# Patient Record
Sex: Male | Born: 1975 | Race: White | Hispanic: No | Marital: Married | Smoking: Never smoker
Health system: Southern US, Community
[De-identification: ages and names within clinical notes are randomized; demographics above are authoritative.]

---

## 2006-04-28 ENCOUNTER — Emergency Department: Payer: Self-pay | Admitting: Emergency Medicine

## 2007-06-18 ENCOUNTER — Emergency Department: Payer: Self-pay | Admitting: Unknown Physician Specialty

## 2009-08-17 IMAGING — CT CT ABD-PELV W/O CM
1 of 2 series · 15 of 32 positions shown, 19 images · non-contrast
Comparison: none

REASON FOR EXAM: (1) left flank pain; (2) left flank pain
COMMENTS:

PROCEDURE:     CT  - CT ABDOMEN AND PELVIS W[DATE]  [DATE]
RESULT:     Comparison: No available comparison exam.
TECHNIQUE: CT examination of the abdomen and pelvis was performed without
contrast. Collimation is 3 mm.

[Series 2: stone · axial · 0.79mm/px · z∈[-554,-144]mm · 15 of 155 slices shown, 19 images]
[im 12/155  soft-tissue]
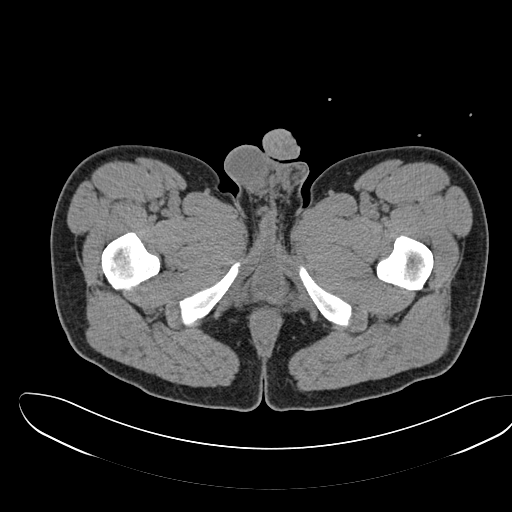
[im 12/155  bone]
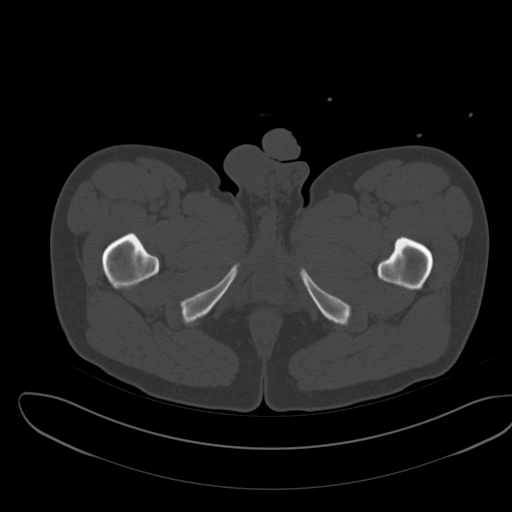
[im 23/155  soft-tissue]
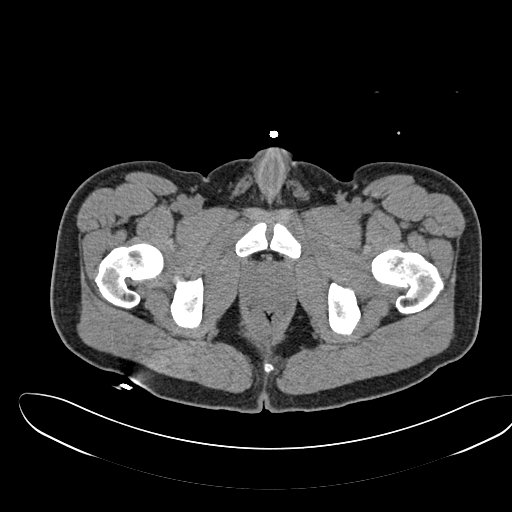
[im 34/155  soft-tissue]
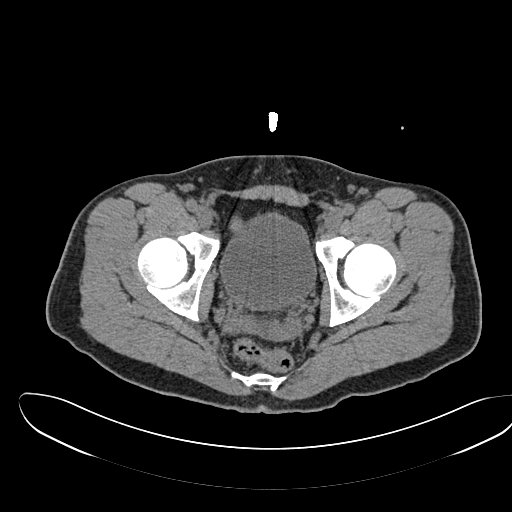
[im 45/155  soft-tissue]
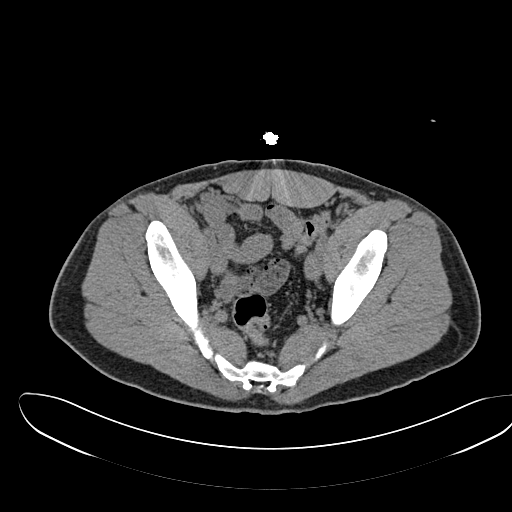
[im 56/155  soft-tissue]
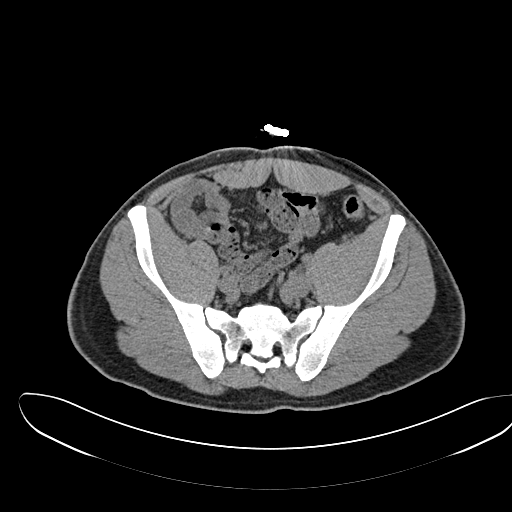
[im 67/155  soft-tissue]
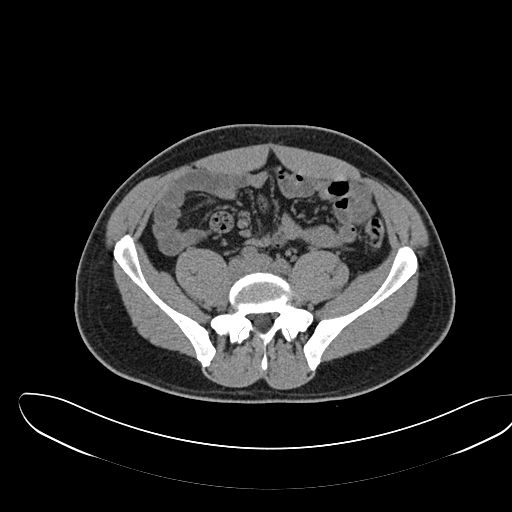
[im 78/155  soft-tissue]
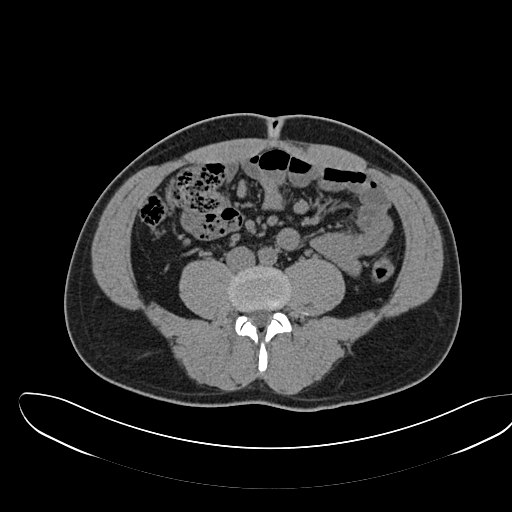
[im 89/155  soft-tissue]
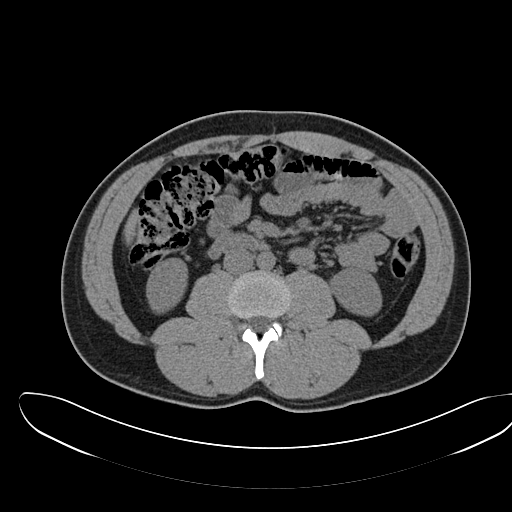
[im 100/155  soft-tissue]
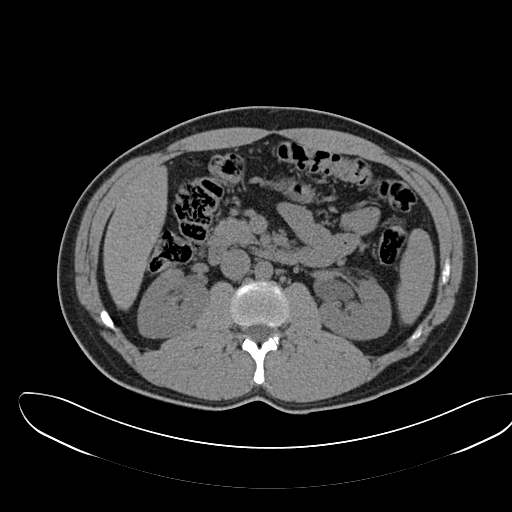
[im 100/155  bone]
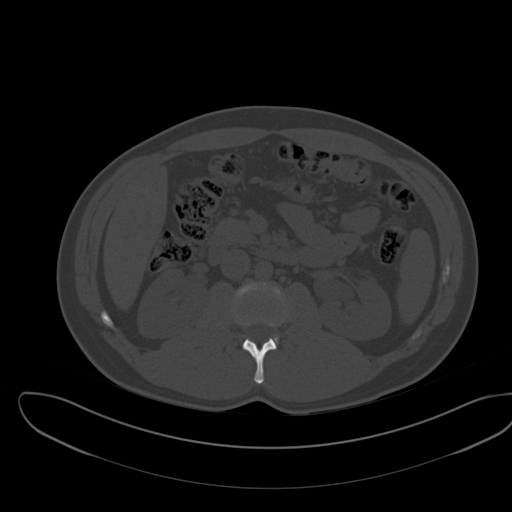
[im 111/155  soft-tissue]
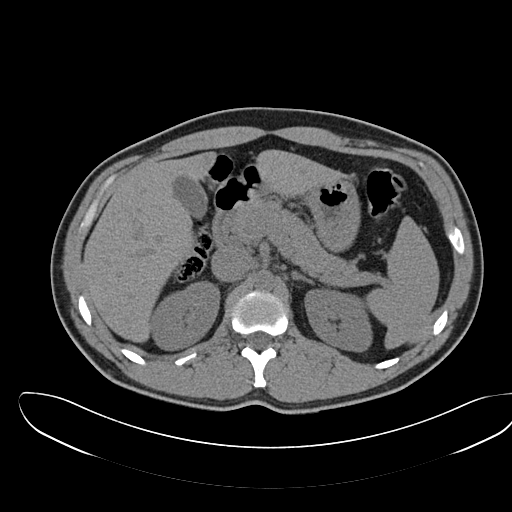
[im 122/155  soft-tissue]
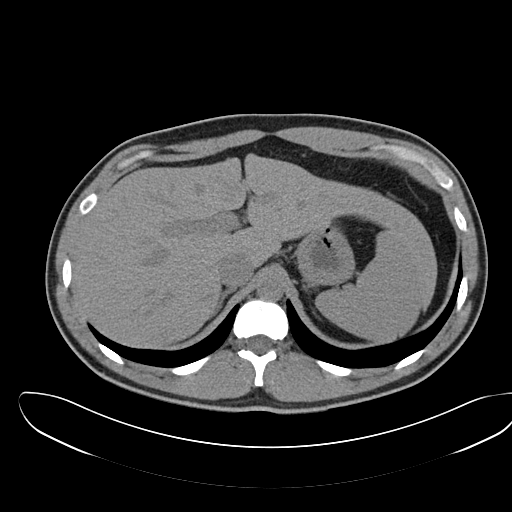
[im 133/155  soft-tissue]
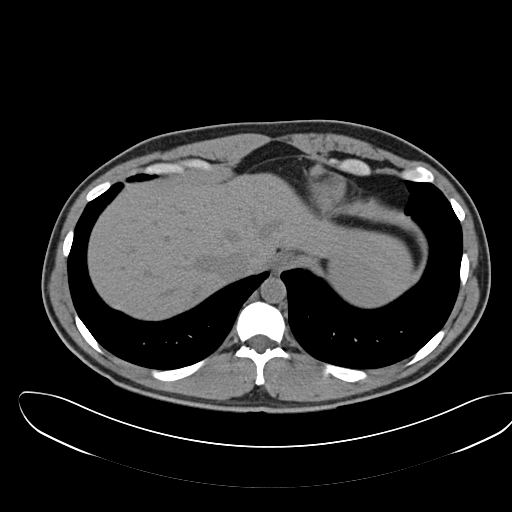
[im 133/155  lung]
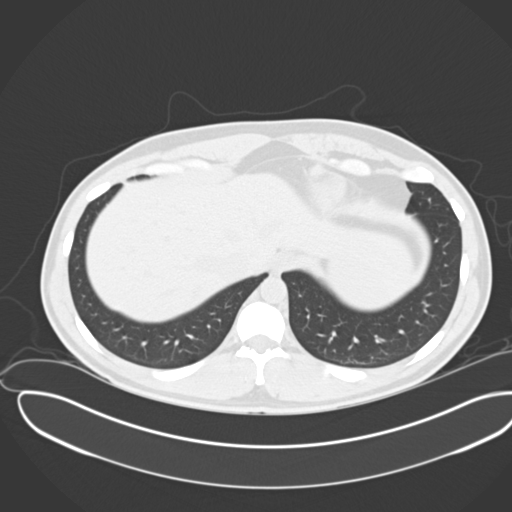
[im 138/155  lung]
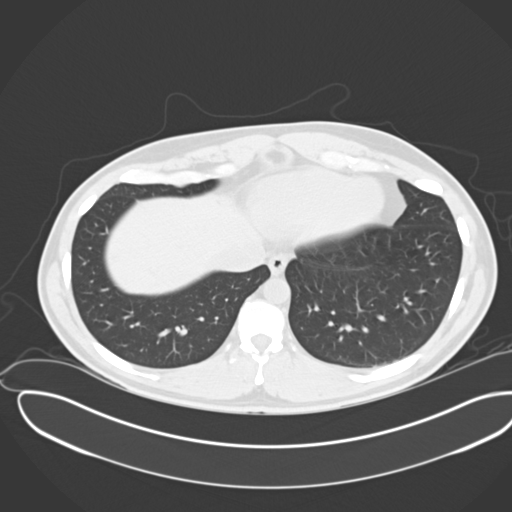
[im 144/155  soft-tissue]
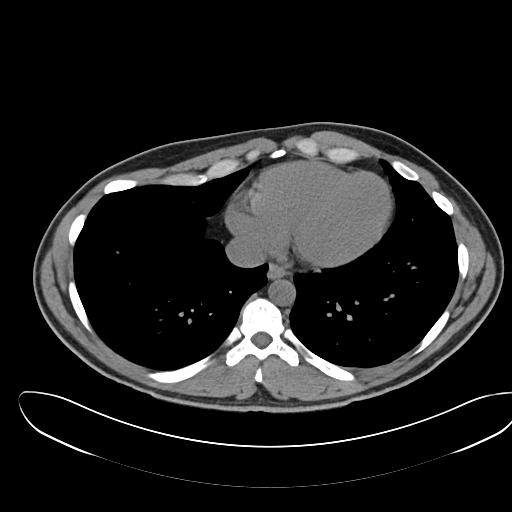
[im 144/155  lung]
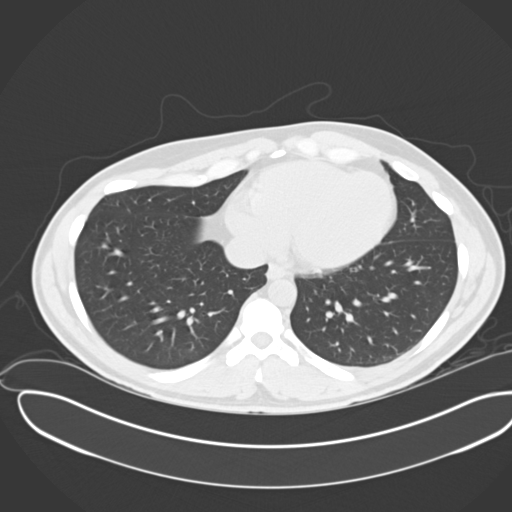
[im 149/155  lung]
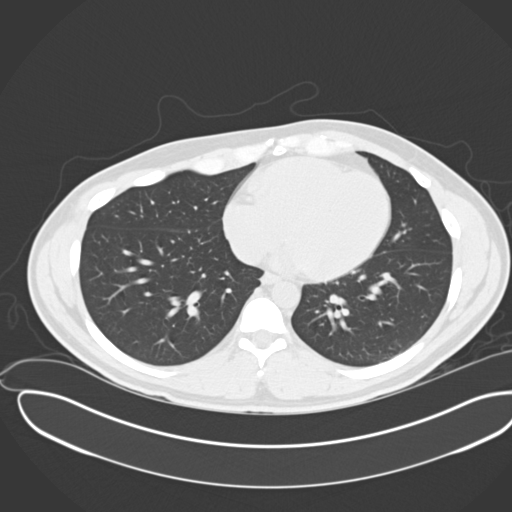

[15 of 32 positions shown; findings below may reference images not displayed]

FINDINGS: Limited evaluation of the lung bases is unremarkable.

Evaluation of the abdominal organs, bowels, and vessels is limited without
contrast. The liver, gallbladder, spleen, pancreas, and adrenal glands are
grossly unremarkable. There is a 1 mm calcification at the left UVJ. There
is no significant left ureteral or renal collecting system dilatation. No
additional renal or ureteral stone is noted.

There is no dilatation of the bowels. The appendix is unremarkable. There is
no significant intra abdominal or pelvic fat stranding. There is no
intraperitoneal free air. There is no significant free fluid. There are no
enlarged abdominal pelvic lymph nodes.
IMPRESSION: 1. There is a 1 mm calcification at the left UVJ. There is no significant
left ureteral or renal collecting system dilatation.

2. Evaluation of abdominal organs, bowels, and vessels is otherwise limited
without contrast. The appendix is unremarkable. There is no bowel
obstruction. There is no significant intra-abdominal or pelvic fat stranding.

## 2016-05-10 ENCOUNTER — Ambulatory Visit
Admission: EM | Admit: 2016-05-10 | Discharge: 2016-05-10 | Disposition: A | Discharge status: Home / Self Care | Payer: BLUE CROSS/BLUE SHIELD | Attending: Family Medicine | Admitting: Family Medicine

## 2016-05-10 ENCOUNTER — Encounter: Payer: Self-pay | Admitting: Emergency Medicine

## 2016-05-10 ENCOUNTER — Emergency Department
Admission: EM | Admit: 2016-05-10 | Discharge: 2016-05-10 | Disposition: A | Discharge status: Home / Self Care | Payer: BLUE CROSS/BLUE SHIELD | Attending: Emergency Medicine | Admitting: Emergency Medicine

## 2016-05-10 ENCOUNTER — Encounter: Payer: Self-pay | Admitting: *Deleted

## 2016-05-10 ENCOUNTER — Emergency Department: Payer: BLUE CROSS/BLUE SHIELD

## 2016-05-10 DIAGNOSIS — I10 Essential (primary) hypertension: Secondary | ICD-10-CM | POA: Diagnosis not present

## 2016-05-10 DIAGNOSIS — R0789 Other chest pain: Secondary | ICD-10-CM | POA: Insufficient documentation

## 2016-05-10 DIAGNOSIS — R079 Chest pain, unspecified: Secondary | ICD-10-CM

## 2016-05-10 LAB — BASIC METABOLIC PANEL
Anion gap: 7 (ref 5–15)
BUN: 13 mg/dL (ref 6–20)
CALCIUM: 9.9 mg/dL (ref 8.9–10.3)
CO2: 31 mmol/L (ref 22–32)
CREATININE: 1.02 mg/dL (ref 0.61–1.24)
Chloride: 101 mmol/L (ref 101–111)
GFR calc Af Amer: >60 mL/min (ref >60)
GFR calc non Af Amer: >60 mL/min (ref >60)
GLUCOSE: 100 mg/dL — AB (ref 65–99)
Potassium: 3.7 mmol/L (ref 3.5–5.1)
Sodium: 139 mmol/L (ref 135–145)

## 2016-05-10 LAB — CBC
HCT: 47.5 % (ref 40.0–52.0)
Hemoglobin: 16 g/dL (ref 13.0–18.0)
MCH: 27.8 pg (ref 26.0–34.0)
MCHC: 33.6 g/dL (ref 32.0–36.0)
MCV: 82.8 fL (ref 80.0–100.0)
PLATELETS: 257 10*3/uL (ref 150–440)
RBC: 5.75 MIL/uL (ref 4.40–5.90)
RDW: 12.8 % (ref 11.5–14.5)
WBC: 7.7 10*3/uL (ref 3.8–10.6)

## 2016-05-10 LAB — TROPONIN I

## 2016-05-10 MED ORDER — HYDROCHLOROTHIAZIDE 12.5 MG PO TABS: 12.5000 mg | ORAL_TABLET | Freq: Every day | ORAL | 0 refills | Status: DC

## 2016-05-10 MED ORDER — CLONIDINE HCL 0.1 MG PO TABS
0.1000 mg | ORAL_TABLET | Freq: Once | ORAL | Status: AC
  Administered 2016-05-10: 0.1 mg via ORAL

## 2016-05-10 MED ORDER — CLONIDINE HCL 0.1 MG PO TABS
ORAL_TABLET | ORAL | Status: AC
  Administered 2016-05-10: 0.1 mg via ORAL
  Filled 2016-05-10: qty 1

## 2016-05-10 MED ORDER — HYDROCHLOROTHIAZIDE 12.5 MG PO CAPS: 12.5000 mg | ORAL_CAPSULE | Freq: Every day | ORAL | 2 refills | Status: DC

## 2016-05-10 NOTE — ED Notes (Signed)
Patient transported to X-ray 

## 2016-05-10 NOTE — ED Provider Notes (Signed)
[facility] Emergency Department Provider Note  Time seen: 6:42 PM  I have reviewed the triage vital signs and the nursing notes.   HISTORY  Chief Complaint Chest Pain    HPI Victor Pacheco is a 41 y.o. male with no past medical history who presents to the emergency department with chest pain and elevated blood pressure. According to the patient he was seen by a chiropractor this week for the first time for some lower back discomfort he has been experiencing. During his evaluation he had his blood pressure checked which was fairly elevated. Patient states he went to a pharmacy today to recheck his blood pressure which remained fairly elevated around 180/127 patient went to urgent care and was started on hydrochlorothiazide 12.5 mg tablets. Patient took his first dose today, rechecked blood pressure tonight which was still elevated. Patient also states he has been expressing chest discomfort or he describes as a pressure type sensation in the center of her chest for the past 2 days so he decided to come to the emergency department for evaluation. Of note the patient states he did a heavy chest workout 2 days ago and he thinks the chest discomfort could be coming from that but he is not sure. Denies any nausea, diaphoresis or shortness of breath.  History reviewed. No pertinent past medical history.  There are no active problems to display for this patient.   History reviewed. No pertinent surgical history.  Prior to Admission medications   Medication Sig Start Date End Date Taking? Authorizing Provider  hydrochlorothiazide (HYDRODIURIL) 12.5 MG tablet Take 1 tablet (12.5 mg total) by mouth daily. 05/10/16   Lutricia Feil, PA-C    No Known Allergies  Family History  Problem Relation Age of Onset  . Hypertension Mother   . Cancer Father   . Hypertension Brother     Social History Social History  Substance Use Topics  . Smoking status: Never Smoker  .  Smokeless tobacco: Never Used  . Alcohol use Yes    Review of Systems Constitutional: Negative for fever Cardiovascular: Central chest discomfort 2 days Respiratory: Negative for shortness of breath. Gastrointestinal: Negative for abdominal pain Neurological: Negative for headache 10-point ROS otherwise negative.  ____________________________________________   PHYSICAL EXAM:  VITAL SIGNS: ED Triage Vitals  Enc Vitals Group     BP 05/10/16 1816 (!) 175/116     Pulse Rate 05/10/16 1816 74     Resp 05/10/16 1816 20     Temp 05/10/16 1816 98.2 F (36.8 C)     Temp Source 05/10/16 1816 Oral     SpO2 05/10/16 1816 100 %     Weight --      Height --      Head Circumference --      Peak Flow --      Pain Score 05/10/16 1814 2     Pain Loc --      Pain Edu? --      Excl. in GC? --     Constitutional: Alert and oriented. Well appearing and in no distress. Eyes: Normal exam ENT   Head: Normocephalic and atraumatic   Mouth/Throat: Mucous membranes are moist. Cardiovascular: Normal rate, regular rhythm. No murmur Respiratory: Normal respiratory effort without tachypnea nor retractions. Breath sounds are clear  Gastrointestinal: Soft and nontender. No distention.  Musculoskeletal: Nontender with normal range of motion in all extremities.  Neurologic:  Normal speech and language. No gross focal neurologic deficits  Skin:  Skin is warm, dry and intact.  Psychiatric: Mood and affect are normal.   ____________________________________________    EKG  EKG reviewed and interpreted by myself shows normal sinus rhythm at 75 bpm, narrow QRS, normal axis, normal intervals, no concerning ST changes.  ____________________________________________    RADIOLOGY  Chest x-ray negative  ____________________________________________   INITIAL IMPRESSION / ASSESSMENT AND PLAN / ED COURSE  Pertinent labs & imaging results that were available during my care of the patient were  reviewed by me and considered in my medical decision making (see chart for details).  Patient presents to the emergency department with chest discomfort for the past 2 days long and elevated blood pressure. Currently the patient appears well, no distress, normal physical examination. Patient is EKG is reassuring. Blood pressure remains elevated in the emergency department currently 184/112. Patient started hydrochlorothiazide today. We will dose a one-time dose of clonidine 0.1 mg. Currently awaiting lab and chest x-ray results which were ordered in triage.  Patient's labs are largely within normal limits. Troponin negative. Chest x-ray negative. EKG appears well. Patient's blood pressure currently 145/117. We will discharge the patient home, I have referred to a primary care physician to monitor the patient's blood pressure. Patient just started hydrochlorothiazide therapy today. He will continue this going home.  ____________________________________________   FINAL CLINICAL IMPRESSION(S) / ED DIAGNOSES  Chest pain Hypertension    Minna AntisKevin Khalessi Blough, MD 05/10/16 1924

## 2016-05-10 NOTE — ED Triage Notes (Signed)
Pt had BP recently checked twice and both times was high. Denies symptoms.

## 2016-05-10 NOTE — Discharge Instructions (Signed)
You have been seen in the emergency department today for chest pain and high blood pressure. Your workup has shown normal results. As we discussed please follow-up with your primary care physician as soon as possible for recheck. Return to the emergency department for any further chest pain, trouble breathing, or any other symptom personally concerning to yourself.  Please recheck blood pressure in 7 days, if it remains elevated greater than 140/100, please increase hydrochlorothiazide from 12.5 mg daily(1 tablet) to 25 mg daily (2 tablets).

## 2016-05-10 NOTE — ED Triage Notes (Signed)
Pt sent over for further eval of high blood pressure and chest pain.

## 2016-05-10 NOTE — ED Provider Notes (Signed)
CSN: [id number]     Arrival date & time 05/10/16  [id number] History   First MD Initiated Contact with Patient 05/10/16 0932     Chief Complaint  Patient presents with  . Hypertension   (Consider location/radiation/quality/duration/timing/severity/associated sxs/prior Treatment) HPI  41 year old male who presents with pressure incidentally found elevated initially chiropractor's office and then at a drugstore. They said it was in the 170/100 range. Has no symptoms. His family history includes a brother with hypertension and mother with hypertension. He does not have a local primary care physician and [facility] but he thinks that that physician has moved. He has had some chest tightness but states that he was lifting weights for his chest yesterday and not unusual for him to have that after that work out. Denies any radiation of pain ,shortness of breath, nausea, vomiting, syncope or near syncope.       History reviewed. No pertinent past medical history. History reviewed. No pertinent surgical history. Family History  Problem Relation Age of Onset  . Hypertension Mother   . Cancer Father   . Hypertension Brother    Social History  Substance Use Topics  . Smoking status: Never Smoker  . Smokeless tobacco: Never Used  . Alcohol use Yes    Review of Systems  Constitutional: Negative for activity change, chills, fatigue and fever.  Respiratory: Positive for chest tightness.   All other systems reviewed and are negative.   Allergies  Patient has no known allergies.  Home Medications   Prior to Admission medications   Medication Sig Start Date End Date Taking? Authorizing Provider  hydrochlorothiazide (HYDRODIURIL) 12.5 MG tablet Take 1 tablet (12.5 mg total) by mouth daily. 05/10/16   Lutricia FeilWilliam P Roemer, PA-C   Meds Ordered and Administered this Visit  Medications - No data to display  BP (!) 168/100 (BP Location: Left Arm)   Pulse 78   Temp 98.5 F  (36.9 C) (Oral)   Resp 16   Ht 5\' 5"  (1.651 m)   Wt 160 lb (72.6 kg)   SpO2 100%   BMI 26.63 kg/m  No data found.   Physical Exam  Constitutional: He is oriented to person, place, and time. He appears well-developed and well-nourished. No distress.  HENT:  Head: Normocephalic and atraumatic.  Right Ear: External ear normal.  Left Ear: External ear normal.  Nose: Nose normal.  Mouth/Throat: Oropharynx is clear and moist. No oropharyngeal exudate.  Eyes: EOM are normal. Pupils are equal, round, and reactive to light. Right eye exhibits no discharge. Left eye exhibits no discharge.  Neck: Normal range of motion. Neck supple.  Cardiovascular: Normal rate, regular rhythm, normal heart sounds and intact distal pulses.  Exam reveals no gallop and no friction rub.   No murmur heard. Pulmonary/Chest: Effort normal and breath sounds normal. No respiratory distress. He has no wheezes. He has no rales.  Abdominal: Soft. Bowel sounds are normal. He exhibits no distension and no mass. There is no tenderness. There is no guarding.  Musculoskeletal: Normal range of motion.  Lymphadenopathy:    He has no cervical adenopathy.  Neurological: He is alert and oriented to person, place, and time.  Skin: Skin is warm and dry. He is not diaphoretic.  Psychiatric: He has a normal mood and affect. His behavior is normal. Judgment and thought content normal.  Nursing note and vitals reviewed.   Urgent Care Course     Procedures (including critical care time)  Labs  Review Labs Reviewed - No data to display  Imaging Review No results found.   Visual Acuity Review  Right Eye Distance:   Left Eye Distance:   Bilateral Distance:    Right Eye Near:   Left Eye Near:    Bilateral Near:         MDM   1. Essential hypertension    Discharge Medication List as of 05/10/2016  9:55 AM    START taking these medications   Details  hydrochlorothiazide (HYDRODIURIL) 12.5 MG tablet Take 1 tablet  (12.5 mg total) by mouth daily., Starting Thu 05/10/2016, Normal      Plan: 1. Test/x-ray results and diagnosis reviewed with patient 2. rx as per orders; risks, benefits, potential side effects reviewed with patient 3. Recommend supportive treatment with Reduction of dietary salt. Have recommended follow-up with her primary care physician who can manage his blood pressure. He should make an appointment for the next week or 2. Until his blood pressure comes under control I recommended against a weight lifting and cardio exercising. I've also recommended he consider purchasing a blood pressure cuff that  he should take his blood pressures randomly 3 times a week only, recording in a log- date, time, pressure and pulse. This was all discussed in the presence of his wife who accompanied him 4. F/u prn if symptoms worsen or don't improve     Lutricia Feil, PA-C 05/10/16 1004

## 2016-06-15 DIAGNOSIS — I10 Essential (primary) hypertension: Secondary | ICD-10-CM

## 2016-06-15 HISTORY — DX: Essential (primary) hypertension: I10

## 2016-10-24 DIAGNOSIS — Z8042 Family history of malignant neoplasm of prostate: Secondary | ICD-10-CM

## 2016-10-24 DIAGNOSIS — E782 Mixed hyperlipidemia: Secondary | ICD-10-CM | POA: Insufficient documentation

## 2016-10-24 HISTORY — DX: Mixed hyperlipidemia: E78.2

## 2016-10-24 HISTORY — DX: Family history of malignant neoplasm of prostate: Z80.42

## 2018-07-10 IMAGING — CR DG CHEST 2V
2 series · 2 of 2 positions shown · non-contrast
Comparison: None.

CLINICAL DATA: 30-year-old presenting with left-sided chest
pressure head left arm pain for several days and hypertension on her
evaluation in the emergency department. Nonsmoker.

EXAM:
CHEST  2 VIEW

[chest pa]
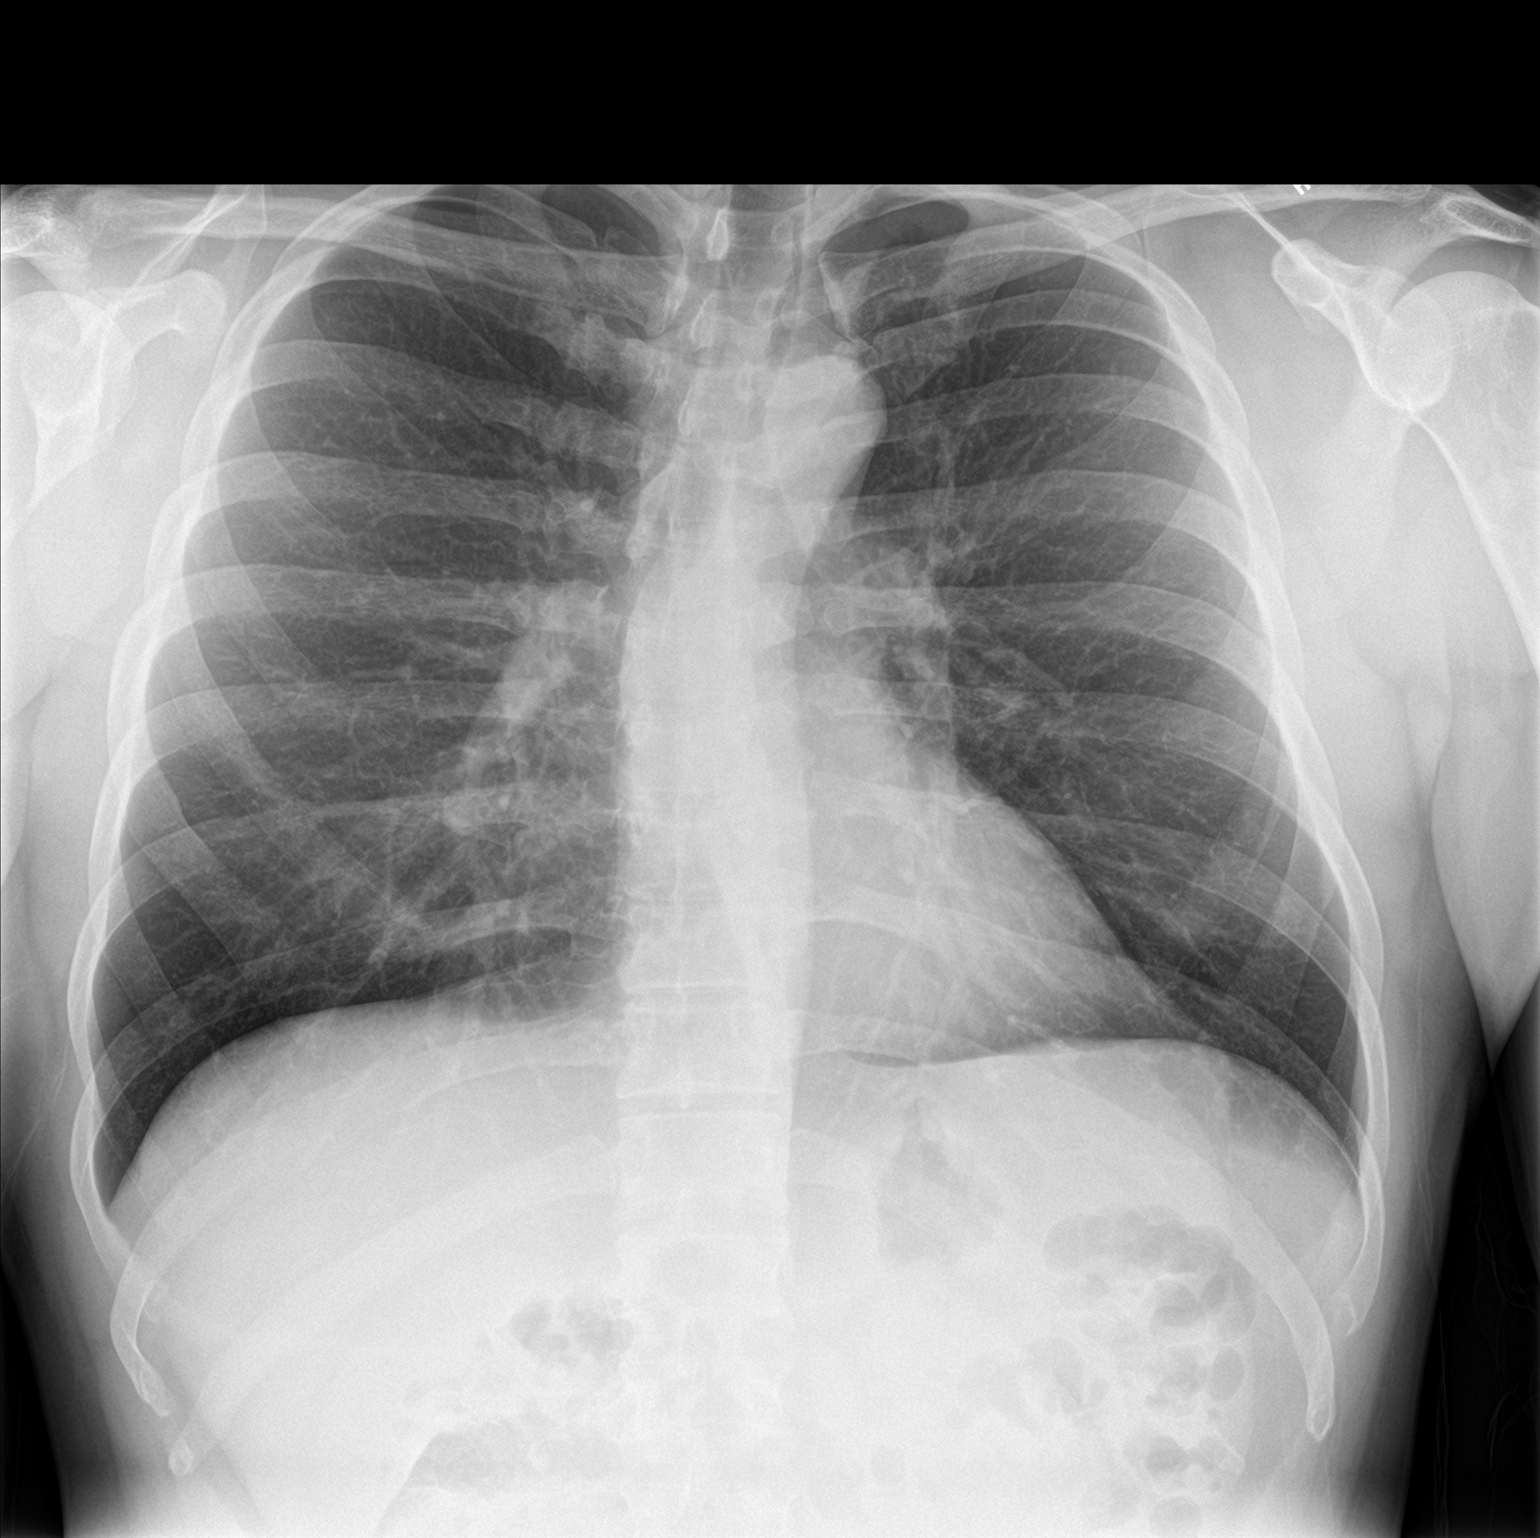

[chest lat]
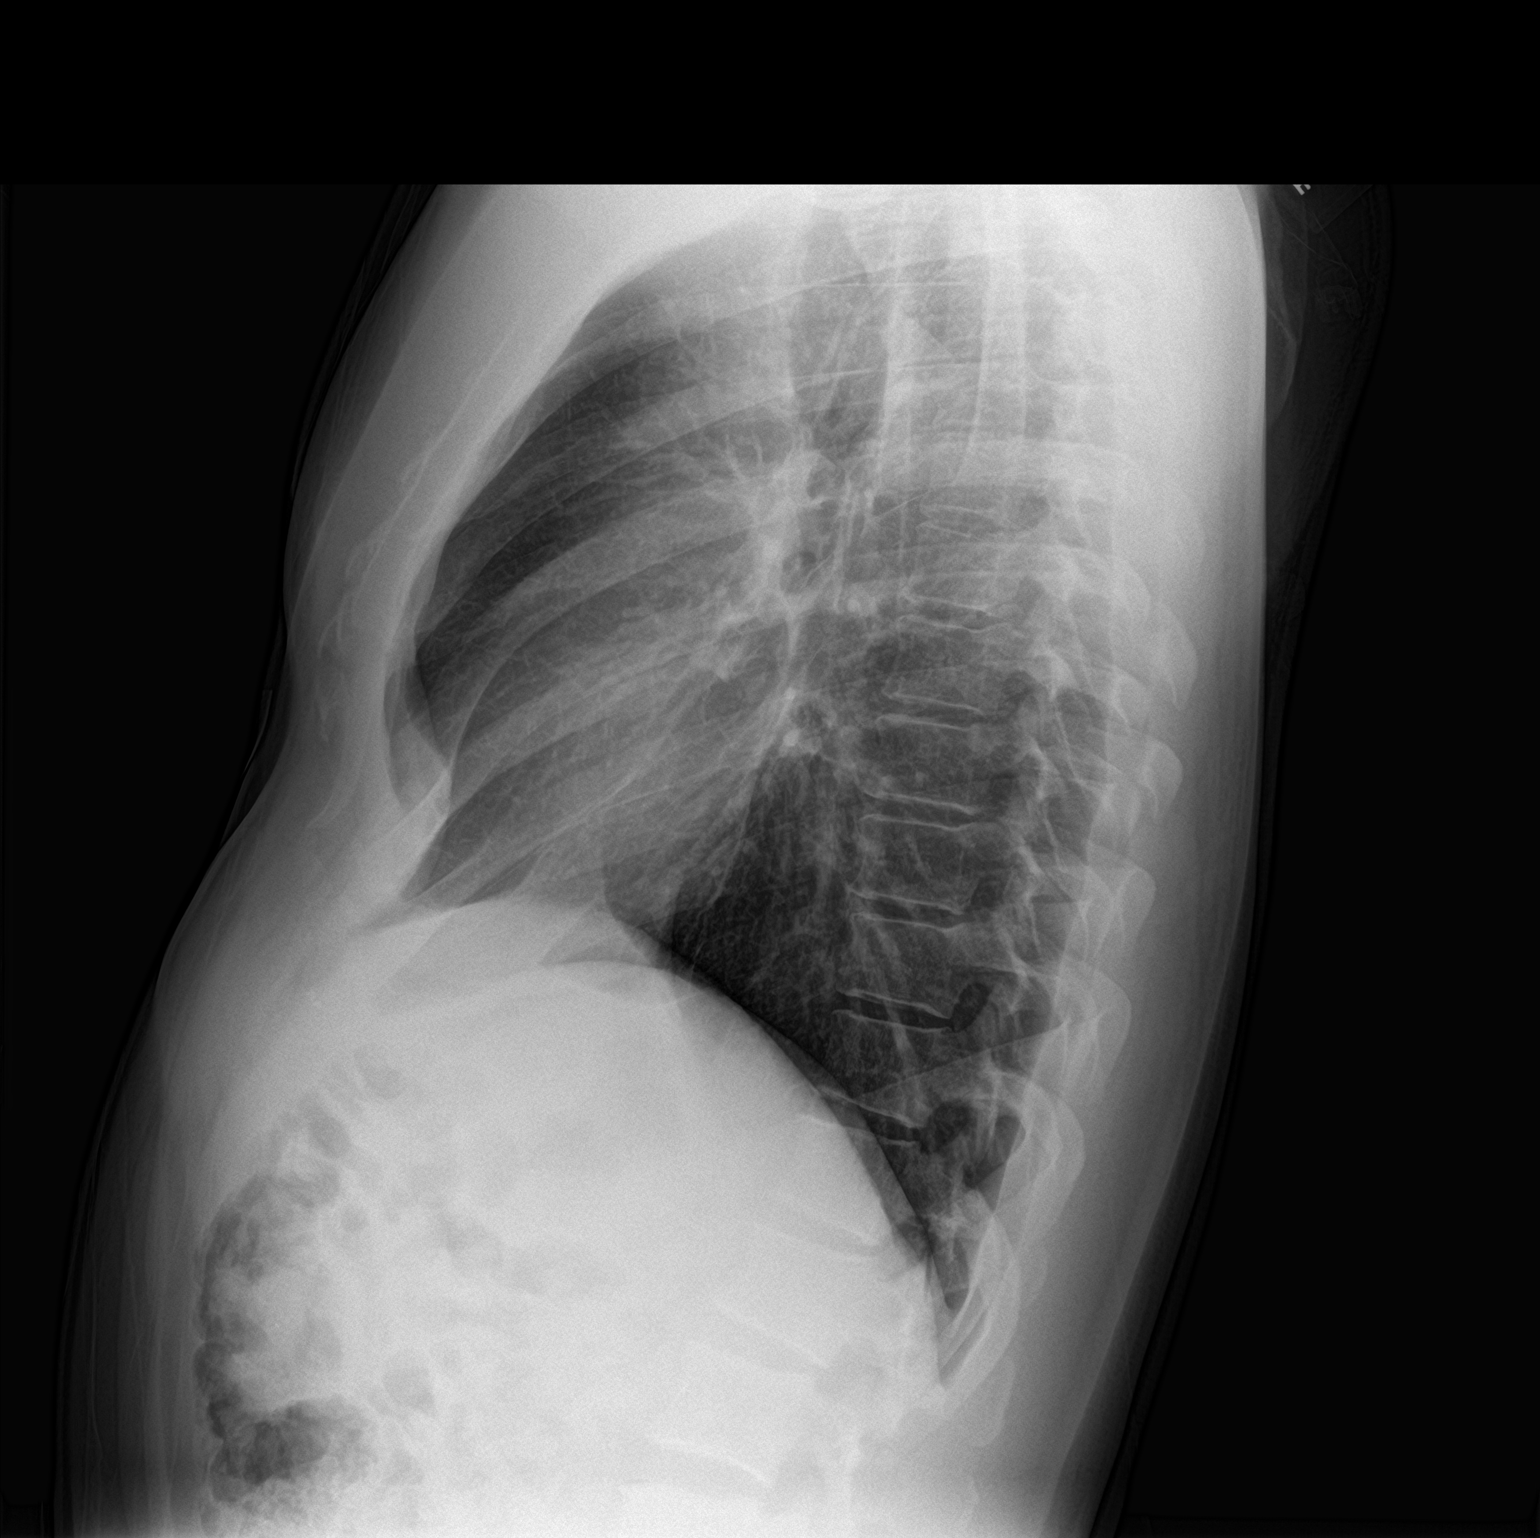

[2 of 2 positions shown; findings below may reference images not displayed]

FINDINGS: Cardiomediastinal silhouette unremarkable. Lungs clear.
Bronchovascular markings normal. Pulmonary vascularity normal. No
visible pleural effusions. No pneumothorax. Mild pectus excavatum
sternal deformity.
IMPRESSION: No acute cardiopulmonary disease.

## 2019-07-10 ENCOUNTER — Ambulatory Visit: Payer: Self-pay | Attending: Internal Medicine

## 2019-07-10 DIAGNOSIS — Z23 Encounter for immunization: Secondary | ICD-10-CM

## 2019-07-10 NOTE — Progress Notes (Signed)
   Covid-19 Vaccination Clinic  Name:  Victor Pacheco    MRN: [id number] DOB: [birth year]  07/10/2019  [name] was observed post Covid-19 immunization for 15 minutes without incident. He was provided with Vaccine Information Sheet and instruction to access the V-Safe system.   [name] was instructed to call 911 with any severe reactions post vaccine: Marland Kitchen Difficulty breathing  . Swelling of face and throat  . A fast heartbeat  . A bad rash all over body  . Dizziness and weakness   Immunizations Administered    Name Date Dose VIS Date Route   Pfizer COVID-19 Vaccine 07/10/2019  8:20 AM 0.3 mL 04/29/2018 Intramuscular   Manufacturer: [employer], [employer]   Lot: [id number]   NDC: [address]-1

## 2019-08-04 ENCOUNTER — Ambulatory Visit: Payer: Self-pay | Attending: Internal Medicine

## 2019-08-04 DIAGNOSIS — Z23 Encounter for immunization: Secondary | ICD-10-CM

## 2019-08-04 NOTE — Progress Notes (Signed)
   Covid-19 Vaccination Clinic  Name:  Victor Pacheco    MRN: [id number] DOB: [birth year]  08/04/2019  Mr. Victor Pacheco was observed post Covid-19 immunization for 15 minutes without incident. He was provided with Vaccine Information Sheet and instruction to access the V-Safe system.   Mr. Victor Pacheco was instructed to call 911 with any severe reactions post vaccine: Marland Kitchen Difficulty breathing  . Swelling of face and throat  . A fast heartbeat  . A bad rash all over body  . Dizziness and weakness   Immunizations Administered    Name Date Dose VIS Date Route   Pfizer COVID-19 Vaccine 08/04/2019  8:06 AM 0.3 mL 04/29/2018 Intramuscular   Manufacturer: [employer], [employer]   Lot: [id number]   NDC: [address]-2

## 2021-10-19 ENCOUNTER — Other Ambulatory Visit: Payer: Self-pay | Admitting: Family

## 2021-10-19 DIAGNOSIS — R109 Unspecified abdominal pain: Secondary | ICD-10-CM

## 2021-10-25 ENCOUNTER — Ambulatory Visit: Admission: RE | Admit: 2021-10-25 | Payer: BC Managed Care – PPO | Source: Ambulatory Visit

## 2021-10-31 ENCOUNTER — Other Ambulatory Visit: Payer: Self-pay | Admitting: *Deleted

## 2021-10-31 ENCOUNTER — Encounter: Payer: Self-pay | Admitting: Urology

## 2021-10-31 ENCOUNTER — Other Ambulatory Visit
Admission: RE | Admit: 2021-10-31 | Discharge: 2021-10-31 | Disposition: A | Discharge status: Home / Self Care | Payer: BC Managed Care – PPO | Attending: Urology | Admitting: Urology

## 2021-10-31 ENCOUNTER — Ambulatory Visit: Payer: BC Managed Care – PPO | Admitting: Urology

## 2021-10-31 VITALS — BP 133/92 | HR 93 | Ht 64.0 in | Wt 148.0 lb

## 2021-10-31 DIAGNOSIS — Z8042 Family history of malignant neoplasm of prostate: Secondary | ICD-10-CM

## 2021-10-31 DIAGNOSIS — N419 Inflammatory disease of prostate, unspecified: Secondary | ICD-10-CM

## 2021-10-31 DIAGNOSIS — R509 Fever, unspecified: Secondary | ICD-10-CM | POA: Diagnosis not present

## 2021-10-31 DIAGNOSIS — R102 Pelvic and perineal pain: Secondary | ICD-10-CM | POA: Diagnosis not present

## 2021-10-31 DIAGNOSIS — R1030 Lower abdominal pain, unspecified: Secondary | ICD-10-CM | POA: Diagnosis not present

## 2021-10-31 LAB — URINALYSIS, COMPLETE (UACMP) WITH MICROSCOPIC
Bilirubin Urine: NEGATIVE
Glucose, UA: NEGATIVE mg/dL
Hgb urine dipstick: NEGATIVE
Ketones, ur: NEGATIVE mg/dL
Leukocytes,Ua: NEGATIVE
Nitrite: NEGATIVE
Protein, ur: NEGATIVE mg/dL
Specific Gravity, Urine: 1.015 (ref 1.005–1.030)
pH: 7 (ref 5.0–8.0)

## 2021-10-31 MED ORDER — CELECOXIB 200 MG PO CAPS: 200.0000 mg | ORAL_CAPSULE | Freq: Two times a day (BID) | ORAL | 0 refills | Status: DC

## 2021-10-31 NOTE — Progress Notes (Signed)
   10/31/21 12:42 PM   Nicholes Calamity [birth year] [id number]  CC: " Prostatitis," for history of prostate cancer  HPI: 46 year old male with a family history of prostate cancer in his father who reports about 2 months of chills, fevers, weakness, and perineal discomfort/pain, as well as some stomach pain with eating of unclear etiology.  He was seen by PCP and felt that this could be prostatitis, I do not see that a urinalysis or culture was checked, and he was treated with 3 weeks of Cipro.  He feels his symptoms did improve somewhat, but they have recurred since stopping antibiotics.  He has minimal urinary symptoms aside from some occasional mild burning.  He has a history of kidney stones that have passed spontaneously.  No imaging to review.  He denies any gross hematuria.  No prior PSA values to review.  Urinalysis today is benign.   PMH: Past Medical History:  Diagnosis Date   Essential hypertension 06/15/2016   Family history of prostate cancer 10/24/2016   Mixed hypercholesterolemia and hypertriglyceridemia 10/24/2016   Family History: Family History  Problem Relation Age of Onset   Hypertension Mother    Cancer Father    Hypertension Brother     Social History:  reports that he has never smoked. He has never been exposed to tobacco smoke. He has never used smokeless tobacco. He reports current alcohol use. He reports that he does not use drugs.  Physical Exam: BP (!) 133/92   Pulse 93   Ht 5\' 4"  (1.626 m)   Wt 148 lb (67.1 kg)   BMI 25.40 kg/m    Constitutional:  Alert and oriented, No acute distress. Cardiovascular: No clubbing, cyanosis, or edema. Respiratory: Normal respiratory effort, no increased work of breathing. GI: Abdomen is soft, nontender, nondistended, no abdominal masses GU: Circumcised phallus with patent meatus, no lesions, testicles 20 cc and descended bilaterally without masses DRE: 50 g, smooth, no nodules or masses  Laboratory  Data: Reviewed  Pertinent Imaging: None to review  Assessment & Plan:   46 year old male with a number of symptoms of unclear etiology including intermittent reported subjective fevers, chills, some perineal pain, lower abdominal pain with eating.  Urinalysis today benign.  Previously had some mild to moderate improvement on course of Cipro for possible prostatitis.  We reviewed possible etiologies at length including prostatitis including the subtypes of acute bacterial versus chronic prostatitis, pelvic floor dysfunction, other abdominal etiology, and even malignancy.  I recommended a trial of Celebrex 200 mg twice daily and pelvic floor stretching exercises provided with close follow-up in 2 to 3 weeks with low threshold for CT abdomen and pelvis abdominal imaging for further evaluation of persistent symptoms at that time.  Also need to check PSA at some point in follow-up, defer checking at this time in the setting of recent possible prostatitis which would cause a false elevation  55, MD 10/31/2021  [facility] [address] [place], [place] [phone]

## 2021-10-31 NOTE — Patient Instructions (Addendum)
Prostatitis  Prostatitis is swelling or inflammation of the prostate gland, also called the prostate. This gland is about 1.5 inches wide and 1 inch high, and it is involved in making semen. The prostate is located below a man's bladder, in front of the rectum. There are four types of prostatitis: Chronic prostatitis (CP), also called chronic pelvic pain syndrome (CPPS). This is the most common type of prostatitis. It is associated with increased muscle tone in the area between the hip bones (pelvic area), around the prostate. This type is also known as a pelvic floor disorder. Chronic bacterial prostatitis. This type usually results from an acute bacterial infection in the prostate gland that keeps coming back or has not been treated properly. The symptoms are less severe than those caused by acute bacterial prostatitis, which lasts a shorter time. Asymptomatic inflammatory prostatitis. This type does not have symptoms and does not need treatment. This is diagnosed when tests are done for other disorders of the urinary tract or reproductive tract. Acute bacterial prostatitis. This type starts quickly and results from an acute bacterial infection in the prostate gland. It is usually associated with a bladder infection, high fever, and chills. This is the least common type of prostatitis. What are the causes? Bacterial prostatitis is caused by an infection from bacteria. Chronic nonbacterial prostatitis may be caused by: Factors related to the nervous system. This system includes thebrain, spinal cord, and nerves. An autoimmune response. This happens when the body's disease-fighting system attacks healthy tissue in the body by mistake. Psychological factors. These have to do with how the mind works. The causes of the other types of prostatitis are usually not known. What are the signs or symptoms? Symptoms of this condition depend on the type of prostatitis you have. Acute bacterial  prostatitis Symptoms may include: Pain or burning during urination. Frequent and sudden urges to urinate. Trouble starting to urinate. Fever. Chills. Pain in your muscles or joints, lower back, or lower abdomen. Other types of prostatitis Symptoms may include: Sudden urges to urinate, or urinating often. Trouble starting to urinate. Weak urine stream. Dribbling after urination. Discharge coming from the penis. Pain in the testicles, the penis, or the tip of the penis. Pain in the area in front of the rectum and below the scrotum (perineum). Pain when ejaculating. How is this diagnosed? This condition may be diagnosed based on: A physical and medical exam. A digital rectal exam. For this, the health care provider may use a finger to feel the prostate. A urine test to check for bacteria. A semen sample or blood tests. Ultrasound. Urodynamic tests to check how your body handles urine. Cystoscopy to look inside your bladder or inside the part of your body that drains urine from the bladder (urethra). How is this treated? Treatment for this condition depends on the type of prostatitis. Treatment may involve: Medicines to relieve pain or inflammation, or to help relax your muscles. Physical therapy. Heat therapy. Biofeedback. These techniques help you control certain body functions. Relaxation exercises. Antibiotic medicine, if your condition is caused by bacteria. Sitz baths. These warm water baths help to relax your pelvic floor muscles, which helps to relieve pressure on the prostate. Follow these instructions at home: Medicines Take over-the-counter and prescription medicines only as told by your health care provider. If you were prescribed an antibiotic medicine, take it as told by your health care provider. Do not stop using the antibiotic even if you start to feel better. Managing pain and swelling    Take sitz baths as directed by your health care provider. For a sitz bath,  sit in warm water that is deep enough to cover your hips and buttocks. If directed, apply heat to the affected area as often as told by your health care provider. Use the heat source that your health care provider recommends, such as a moist heat pack or a heating pad. Place a towel between your skin and the heat source. Leave the heat on for 20-30 minutes. Remove the heat if your skin turns bright red. This is especially important if you are unable to feel pain, heat, or cold. You may have a greater risk of getting burned. General instructions Do exercises as told by your health care provider, if you were prescribed physical therapy, biofeedback, or relaxation exercises. Keep all follow-up visits as told by your health care provider. This is important. Where to find more information National Institute of Diabetes and Digestive and Kidney Diseases: https://www.niddk.nih.gov Contact a health care provider if: Your symptoms get worse. You have a fever. Get help right away if: You have chills. You feel light-headed or feel like you may faint. You cannot urinate. You have blood or blood clots in your urine. Summary Prostatitis is swelling or inflammation of the prostate gland. Treatment for this condition depends on the type of prostatitis. Take over-the-counter and prescription medicines only as told by your health care provider. Get help right away of you have chills, feel light-headed, feel like you may faint, cannot urinate, or have blood or blood clots in your urine. This information is not intended to replace advice given to you by your health care provider. Make sure you discuss any questions you have with your health care provider. Document Revised: 03/27/2019 Document Reviewed: 03/27/2019 Elsevier Patient Education  2023 [employer] Inc.  Prostate Cancer Screening  Prostate cancer screening is testing that is done to check for the presence of prostate cancer in men. The prostate gland  is a walnut-sized gland that is located below the bladder and in front of the rectum in males. The function of the prostate is to add fluid to semen during ejaculation. Prostate cancer is one of the most common types of cancer in men. Who should have prostate cancer screening? Screening recommendations vary based on age and other risk factors, as well as between the professional organizations who make the recommendations. In general, screening is recommended if: You are age 50 to 70 and have an average risk for prostate cancer. You should talk with your health care provider about your need for screening and how often screening should be done. Because most prostate cancers are slow growing and will not cause death, screening in this age group is generally reserved for men who have a 10- to 15-year life expectancy. You are younger than age 50, and you have these risk factors: Having a father, brother, or uncle who has been diagnosed with prostate cancer. The risk is higher if your family member's cancer occurred at an early age or if you have multiple family members with prostate cancer at an early age. Being a male who is Black or is of Caribbean or sub-Saharan African descent. In general, screening is not recommended if: You are younger than age 40. You are between the ages of 40 and 49 and you have no risk factors. You are 70 years of age or older. At this age, the risks that screening can cause are greater than the benefits that it may provide. If you   are at high risk for prostate cancer, your health care provider may recommend that you have screenings more often or that you start screening at a younger age. How is screening for prostate cancer done? The recommended prostate cancer screening test is a blood test called the prostate-specific antigen (PSA) test. PSA is a protein that is made in the prostate. As you age, your prostate naturally produces more PSA. Abnormally high PSA levels may be caused  by: Prostate cancer. An enlarged prostate that is not caused by cancer (benign prostatic hyperplasia, or BPH). This condition is very common in older men. A prostate gland infection (prostatitis) or urinary tract infection. Certain medicines such as male hormones (like testosterone) or other medicines that raise testosterone levels. A rectal exam may be done as part of prostate cancer screening to help provide information about the size of your prostate gland. When a rectal exam is performed, it should be done after the PSA level is drawn to avoid any effect on the results. Depending on the PSA results, you may need more tests, such as: A physical exam to check the size of your prostate gland, if not done as part of screening. Blood and imaging tests. A procedure to remove tissue samples from your prostate gland for testing (biopsy). This is the only way to know for certain if you have prostate cancer. What are the benefits of prostate cancer screening? Screening can help to identify cancer at an early stage, before symptoms start and when the cancer can be treated more easily. There is a small chance that screening may lower your risk of dying from prostate cancer. The chance is small because prostate cancer is a slow-growing cancer, and most men with prostate cancer die from a different cause. What are the risks of prostate cancer screening? The main risk of prostate cancer screening is diagnosing and treating prostate cancer that would never have caused any symptoms or problems. This is called overdiagnosisand overtreatment. PSA screening cannot tell you if your PSA is high due to cancer or a different cause. A prostate biopsy is the only procedure to diagnose prostate cancer. Even the results of a biopsy may not tell you if your cancer needs to be treated. Slow-growing prostate cancer may not need any treatment other than monitoring, so diagnosing and treating it may cause unnecessary stress or  other side effects. Questions to ask your health care provider When should I start prostate cancer screening? What is my risk for prostate cancer? How often do I need screening? What type of screening tests do I need? How do I get my test results? What do my results mean? Do I need treatment? Where to find more information The American Cancer Society: www.cancer.org American Urological Association: www.auanet.org Contact a health care provider if: You have difficulty urinating. You have pain when you urinate or ejaculate. You have blood in your urine or semen. You have pain in your back or in the area of your prostate. Summary Prostate cancer is a common type of cancer in men. The prostate gland is located below the bladder and in front of the rectum. This gland adds fluid to semen during ejaculation. Prostate cancer screening may identify cancer at an early stage, when the cancer can be treated more easily and is less likely to have spread to other areas of the body. The prostate-specific antigen (PSA) test is the recommended screening test for prostate cancer, but it has associated risks. Discuss the risks and benefits of prostate   cancer screening with your health care provider. If you are age 70 or older, the risks that screening can cause are greater than the benefits that it may provide. This information is not intended to replace advice given to you by your health care provider. Make sure you discuss any questions you have with your health care provider. Document Revised: 08/15/2020 Document Reviewed: 08/15/2020 Elsevier Patient Education  2023 Elsevier Inc.  

## 2021-11-28 ENCOUNTER — Ambulatory Visit: Payer: BC Managed Care – PPO | Admitting: Urology

## 2021-11-28 ENCOUNTER — Encounter: Payer: Self-pay | Admitting: Urology

## 2021-11-28 VITALS — BP 151/92 | HR 83 | Ht 64.0 in | Wt 154.0 lb

## 2021-11-28 DIAGNOSIS — Z8049 Family history of malignant neoplasm of other genital organs: Secondary | ICD-10-CM

## 2021-11-28 DIAGNOSIS — R102 Pelvic and perineal pain: Secondary | ICD-10-CM

## 2021-11-28 DIAGNOSIS — Z125 Encounter for screening for malignant neoplasm of prostate: Secondary | ICD-10-CM | POA: Diagnosis not present

## 2021-11-28 DIAGNOSIS — R300 Dysuria: Secondary | ICD-10-CM

## 2021-11-28 NOTE — Progress Notes (Signed)
   11/28/2021 10:03 AM   Rosealee Albee [birth year] [id number]  Reason for visit: Follow up pelvic pain, urinary symptoms, family history of prostate cancer  HPI: 46 year old male who I saw previously on 10/31/2021 for a number of symptoms including 2 months of chills, fevers, weakness, perineal discomfort/pain, dysuria and abdominal pain.  He was treated with antibiotics by PCP for possible prostatitis with only mild improvement in his symptoms.  Urinalysis at our last visit was benign, and I felt his symptoms were more consistent with pelvic floor dysfunction.  We trialed Celebrex 200 mg daily and I recommended pelvic floor stretching exercises.  He did have significant improvement on the Celebrex, but feels like his symptoms have returned somewhat over the last few days.  He has not been consistent with any of the pelvic floor exercises.  We discussed options including referral to pelvic floor physical therapy or CT abdomen and pelvis with contrast to evaluate for other etiology, he would like to start by working on some of the pelvic floor stretching exercises at home and will call to let us know how he is doing.  Regarding his family history of prostate cancer, he has a normal PSA of 0.75 from December 2022, and recommend continuing screening every 1 to 2 years through age 70.  RTC 3 to 4 months symptom check, sooner if problems   [place], [place] [address], [place], [place] [address] [phone]

## 2021-11-28 NOTE — Patient Instructions (Signed)
Pelvic Floor Dysfunction, Male     Pelvic floor dysfunction (PFD) is a condition that results when the group of muscles and connective tissues that support the organs in the pelvis (pelvic floor muscles) do not work well. These muscles and their connections form a sling that supports the colon and bladder. In men, these muscles also support the prostate gland. PFD causes pelvic floor muscles to be too weak, too tight, or both. In PFD, muscle movements are not coordinated. This may cause bowel or bladder problems. It may also cause pain. What are the causes? This condition may be caused by an injury to the pelvic area or by a weakening of pelvic muscles. In many cases, the exact cause is not known. What increases the risk? The following factors may make you more likely to develop PFD: Having chronic bladder tissue inflammation (interstitial cystitis). Being an older person. Being overweight. History of radiation treatment for cancer in the pelvic region. Previous pelvic surgery, such as removal of the prostate gland (prostatectomy). What are the signs or symptoms? Symptoms of this condition vary and may include: Bladder symptoms, such as: Trouble starting urination and emptying the bladder. Frequent urinary tract infections. Leaking urine when coughing, laughing, or exercising (stress incontinence). Having to pass urine urgently or frequently. Pain when passing urine. Bowel symptoms, such as: Constipation. Urgent or frequent bowel movements. Incomplete bowel movements. Painful bowel movements. Leaking stool or gas. Unexplained genital or rectal pain. Genital or rectal muscle spasms. Low back pain. Sexual dysfunction, such as erectile dysfunction, premature ejaculation, or pain during or after sexual activity. How is this diagnosed? This condition is diagnosed based on: Your symptoms and medical history. A physical exam. During the exam, your health care provider may check your  pelvic muscles for tightness, spasm, pain, or weakness. This may include a rectal exam. In some cases, you may have diagnostic tests, such as: Electrical muscle function tests. Urine flow testing. X-ray tests of bowel function. Ultrasound of the pelvic organs. How is this treated? Treatment for this condition depends on your symptoms. Treatment options include: Physical therapy. This may include Kegel exercises to help relax or strengthen the pelvic floor muscles. Biofeedback. This type of therapy provides feedback on how tight your pelvic floor muscles are so that you can learn to control them. Massage therapy. A treatment that involves electrical stimulation of the pelvic floor muscles to help control pain (transcutaneous electrical nerve stimulation, or TENS). Sound wave therapy (ultrasound) to reduce muscle spasms. Medicines, such as: Muscle relaxants. Bladder control medicines. Surgery to reconstruct or support pelvic floor muscles may be an option if other treatments do not help. Follow these instructions at home: Activity Do your usual activities as told by your health care provider. Ask your health care provider if you should modify any activities. Do pelvic floor strengthening or relaxing exercises at home as told by your physical therapist. Lifestyle Maintain a healthy weight. Eat foods that are high in fiber, such as beans, whole grains, and fresh fruits and vegetables. Limit foods that are high in fat and processed sugars, such as fried or sweet foods. Manage stress with relaxation techniques such as yoga or meditation. General instructions If you have problems with leakage: Use absorbable pads or wear padded underwear. Wash your genital and anal area frequently with mild soap. Keep your genital and anal area as clean and dry as possible. Ask your health care provider if you should try a barrier cream to prevent skin irritation. Take warm baths   to relieve pelvic muscle  tension or spasms. Take over-the-counter and prescription medicines only as told by your health care provider. Keep all follow-up visits. How is this prevented? The cause of PFD is not always known, but there are a few things you can do to reduce the risk of developing this condition, including: Staying at a healthy weight. Getting regular exercise. Managing stress. Contact a health care provider if: Your symptoms are not improving with home care. You have signs or symptoms of PFD that get worse. You develop new signs or symptoms. You have signs of a urinary tract infection, such as: Fever. Chills. Increased urinary frequency. A burning feeling when urinating. You have not had a bowel movement in 3 days (constipation). Summary Pelvic floor dysfunction results when the muscles and connective tissues in your pelvic floor do not work well. These muscles and their connections form a sling that supports your colon and bladder. In men, these muscles also support the prostate gland. PFD may be caused by an injury to the pelvic area or by a weakening of pelvic muscles. PFD causes pelvic floor muscles to be too weak, too tight, or a combination of both. Symptoms may vary from person to person. In most cases, PFD can be treated with physical therapies and medicines. Surgery may be an option if other treatments do not help. This information is not intended to replace advice given to you by your health care provider. Make sure you discuss any questions you have with your health care provider. Document Revised: 06/29/2020 Document Reviewed: 06/29/2020 Elsevier Patient Education  2023 [employer] Inc.    

## 2022-04-03 ENCOUNTER — Ambulatory Visit: Payer: BC Managed Care – PPO | Admitting: Urology
